# Patient Record
Sex: Female | Born: 1950 | Hispanic: No | Marital: Married | Smoking: Never smoker
Health system: Southern US, Community
[De-identification: ages and names within clinical notes are randomized; demographics above are authoritative.]

## PROBLEM LIST (undated history)

## (undated) DIAGNOSIS — E785 Hyperlipidemia, unspecified: Secondary | ICD-10-CM

## (undated) DIAGNOSIS — I2510 Atherosclerotic heart disease of native coronary artery without angina pectoris: Secondary | ICD-10-CM

## (undated) HISTORY — DX: Hyperlipidemia, unspecified: E78.5

## (undated) HISTORY — DX: Atherosclerotic heart disease of native coronary artery without angina pectoris: I25.10

---

## 2000-04-28 ENCOUNTER — Ambulatory Visit (HOSPITAL_COMMUNITY): Admission: RE | Admit: 2000-04-28 | Discharge: 2000-04-28 | Payer: Self-pay | Admitting: Endocrinology

## 2000-04-28 ENCOUNTER — Encounter: Payer: Self-pay | Admitting: Endocrinology

## 2000-05-13 ENCOUNTER — Other Ambulatory Visit: Admission: RE | Admit: 2000-05-13 | Discharge: 2000-05-13 | Payer: Self-pay | Admitting: Obstetrics and Gynecology

## 2001-05-18 ENCOUNTER — Other Ambulatory Visit: Admission: RE | Admit: 2001-05-18 | Discharge: 2001-05-18 | Payer: Self-pay | Admitting: Obstetrics and Gynecology

## 2001-12-06 ENCOUNTER — Other Ambulatory Visit: Admission: RE | Admit: 2001-12-06 | Discharge: 2001-12-06 | Payer: Self-pay | Admitting: Obstetrics and Gynecology

## 2002-07-10 ENCOUNTER — Other Ambulatory Visit: Admission: RE | Admit: 2002-07-10 | Discharge: 2002-07-10 | Payer: Self-pay | Admitting: Obstetrics and Gynecology

## 2003-02-07 ENCOUNTER — Other Ambulatory Visit: Admission: RE | Admit: 2003-02-07 | Discharge: 2003-02-07 | Payer: Self-pay | Admitting: Obstetrics and Gynecology

## 2004-03-11 ENCOUNTER — Other Ambulatory Visit: Admission: RE | Admit: 2004-03-11 | Discharge: 2004-03-11 | Payer: Self-pay | Admitting: Obstetrics and Gynecology

## 2005-04-28 ENCOUNTER — Other Ambulatory Visit: Admission: RE | Admit: 2005-04-28 | Discharge: 2005-04-28 | Payer: Self-pay | Admitting: Obstetrics and Gynecology

## 2006-12-09 ENCOUNTER — Emergency Department (HOSPITAL_COMMUNITY): Admission: EM | Admit: 2006-12-09 | Discharge: 2006-12-09 | Payer: Self-pay | Admitting: Emergency Medicine

## 2009-04-30 ENCOUNTER — Encounter: Admission: RE | Admit: 2009-04-30 | Discharge: 2009-04-30 | Payer: Self-pay | Admitting: Obstetrics and Gynecology

## 2010-08-03 ENCOUNTER — Encounter: Payer: Self-pay | Admitting: Obstetrics and Gynecology

## 2015-08-30 ENCOUNTER — Other Ambulatory Visit: Payer: Self-pay | Admitting: Obstetrics and Gynecology

## 2015-08-30 DIAGNOSIS — R928 Other abnormal and inconclusive findings on diagnostic imaging of breast: Secondary | ICD-10-CM

## 2015-09-06 ENCOUNTER — Ambulatory Visit
Admission: RE | Admit: 2015-09-06 | Discharge: 2015-09-06 | Disposition: A | Discharge status: Home / Self Care | Payer: BLUE CROSS/BLUE SHIELD | Source: Ambulatory Visit | Attending: Obstetrics and Gynecology | Admitting: Obstetrics and Gynecology

## 2015-09-06 DIAGNOSIS — R928 Other abnormal and inconclusive findings on diagnostic imaging of breast: Secondary | ICD-10-CM

## 2016-08-14 ENCOUNTER — Other Ambulatory Visit: Payer: Self-pay | Admitting: Obstetrics and Gynecology

## 2016-08-14 DIAGNOSIS — R921 Mammographic calcification found on diagnostic imaging of breast: Secondary | ICD-10-CM

## 2016-08-28 ENCOUNTER — Ambulatory Visit
Admission: RE | Admit: 2016-08-28 | Discharge: 2016-08-28 | Disposition: A | Discharge status: Home / Self Care | Payer: BLUE CROSS/BLUE SHIELD | Source: Ambulatory Visit | Attending: Obstetrics and Gynecology | Admitting: Obstetrics and Gynecology

## 2016-08-28 DIAGNOSIS — R921 Mammographic calcification found on diagnostic imaging of breast: Secondary | ICD-10-CM

## 2017-06-09 ENCOUNTER — Ambulatory Visit: Payer: BLUE CROSS/BLUE SHIELD | Admitting: Family Medicine

## 2017-09-23 ENCOUNTER — Other Ambulatory Visit: Payer: Self-pay | Admitting: Obstetrics and Gynecology

## 2017-09-23 DIAGNOSIS — R921 Mammographic calcification found on diagnostic imaging of breast: Secondary | ICD-10-CM

## 2017-10-20 ENCOUNTER — Ambulatory Visit
Admission: RE | Admit: 2017-10-20 | Discharge: 2017-10-20 | Disposition: A | Discharge status: Home / Self Care | Payer: BLUE CROSS/BLUE SHIELD | Source: Ambulatory Visit | Attending: Obstetrics and Gynecology | Admitting: Obstetrics and Gynecology

## 2017-10-20 DIAGNOSIS — R921 Mammographic calcification found on diagnostic imaging of breast: Secondary | ICD-10-CM

## 2018-05-17 DIAGNOSIS — R69 Illness, unspecified: Secondary | ICD-10-CM | POA: Diagnosis not present

## 2018-05-18 DIAGNOSIS — H52203 Unspecified astigmatism, bilateral: Secondary | ICD-10-CM | POA: Diagnosis not present

## 2018-05-18 DIAGNOSIS — H524 Presbyopia: Secondary | ICD-10-CM | POA: Diagnosis not present

## 2018-06-14 DIAGNOSIS — Z0100 Encounter for examination of eyes and vision without abnormal findings: Secondary | ICD-10-CM | POA: Diagnosis not present

## 2018-06-14 DIAGNOSIS — R69 Illness, unspecified: Secondary | ICD-10-CM | POA: Diagnosis not present

## 2018-06-21 DIAGNOSIS — E559 Vitamin D deficiency, unspecified: Secondary | ICD-10-CM | POA: Diagnosis not present

## 2018-06-21 DIAGNOSIS — E7800 Pure hypercholesterolemia, unspecified: Secondary | ICD-10-CM | POA: Diagnosis not present

## 2018-08-02 DIAGNOSIS — E7800 Pure hypercholesterolemia, unspecified: Secondary | ICD-10-CM | POA: Diagnosis not present

## 2018-08-02 DIAGNOSIS — R634 Abnormal weight loss: Secondary | ICD-10-CM | POA: Diagnosis not present

## 2018-09-06 DIAGNOSIS — Z0000 Encounter for general adult medical examination without abnormal findings: Secondary | ICD-10-CM | POA: Diagnosis not present

## 2018-09-06 DIAGNOSIS — Z8679 Personal history of other diseases of the circulatory system: Secondary | ICD-10-CM | POA: Diagnosis not present

## 2018-09-06 DIAGNOSIS — E7800 Pure hypercholesterolemia, unspecified: Secondary | ICD-10-CM | POA: Diagnosis not present

## 2019-03-13 DIAGNOSIS — H52203 Unspecified astigmatism, bilateral: Secondary | ICD-10-CM | POA: Diagnosis not present

## 2019-03-15 DIAGNOSIS — Z0100 Encounter for examination of eyes and vision without abnormal findings: Secondary | ICD-10-CM | POA: Diagnosis not present

## 2019-03-27 DIAGNOSIS — R69 Illness, unspecified: Secondary | ICD-10-CM | POA: Diagnosis not present

## 2019-04-06 DIAGNOSIS — K641 Second degree hemorrhoids: Secondary | ICD-10-CM | POA: Diagnosis not present

## 2019-04-06 DIAGNOSIS — K600 Acute anal fissure: Secondary | ICD-10-CM | POA: Diagnosis not present

## 2019-04-06 DIAGNOSIS — K625 Hemorrhage of anus and rectum: Secondary | ICD-10-CM | POA: Diagnosis not present

## 2019-04-06 DIAGNOSIS — Z1211 Encounter for screening for malignant neoplasm of colon: Secondary | ICD-10-CM | POA: Diagnosis not present

## 2019-04-14 DIAGNOSIS — Z1211 Encounter for screening for malignant neoplasm of colon: Secondary | ICD-10-CM | POA: Diagnosis not present

## 2019-04-14 DIAGNOSIS — K625 Hemorrhage of anus and rectum: Secondary | ICD-10-CM | POA: Diagnosis not present

## 2019-04-14 DIAGNOSIS — K641 Second degree hemorrhoids: Secondary | ICD-10-CM | POA: Diagnosis not present

## 2019-05-03 DIAGNOSIS — D225 Melanocytic nevi of trunk: Secondary | ICD-10-CM | POA: Diagnosis not present

## 2019-05-03 DIAGNOSIS — L710 Perioral dermatitis: Secondary | ICD-10-CM | POA: Diagnosis not present

## 2019-05-17 DIAGNOSIS — Z20828 Contact with and (suspected) exposure to other viral communicable diseases: Secondary | ICD-10-CM | POA: Diagnosis not present

## 2019-07-04 DIAGNOSIS — Z1231 Encounter for screening mammogram for malignant neoplasm of breast: Secondary | ICD-10-CM | POA: Diagnosis not present

## 2019-07-04 DIAGNOSIS — Z01419 Encounter for gynecological examination (general) (routine) without abnormal findings: Secondary | ICD-10-CM | POA: Diagnosis not present

## 2019-07-04 DIAGNOSIS — Z6823 Body mass index (BMI) 23.0-23.9, adult: Secondary | ICD-10-CM | POA: Diagnosis not present

## 2019-10-19 DIAGNOSIS — R69 Illness, unspecified: Secondary | ICD-10-CM | POA: Diagnosis not present

## 2019-11-20 DIAGNOSIS — Z8742 Personal history of other diseases of the female genital tract: Secondary | ICD-10-CM | POA: Diagnosis not present

## 2019-11-20 DIAGNOSIS — Z0000 Encounter for general adult medical examination without abnormal findings: Secondary | ICD-10-CM | POA: Diagnosis not present

## 2019-11-20 DIAGNOSIS — E7800 Pure hypercholesterolemia, unspecified: Secondary | ICD-10-CM | POA: Diagnosis not present

## 2019-11-20 DIAGNOSIS — Z8679 Personal history of other diseases of the circulatory system: Secondary | ICD-10-CM | POA: Diagnosis not present

## 2019-11-20 DIAGNOSIS — R0600 Dyspnea, unspecified: Secondary | ICD-10-CM | POA: Diagnosis not present

## 2019-11-20 DIAGNOSIS — R001 Bradycardia, unspecified: Secondary | ICD-10-CM | POA: Diagnosis not present

## 2019-11-20 DIAGNOSIS — E559 Vitamin D deficiency, unspecified: Secondary | ICD-10-CM | POA: Diagnosis not present

## 2019-11-20 DIAGNOSIS — R5383 Other fatigue: Secondary | ICD-10-CM | POA: Diagnosis not present

## 2019-11-20 DIAGNOSIS — Z833 Family history of diabetes mellitus: Secondary | ICD-10-CM | POA: Diagnosis not present

## 2019-11-28 DIAGNOSIS — R69 Illness, unspecified: Secondary | ICD-10-CM | POA: Diagnosis not present

## 2020-02-28 IMAGING — MG DIGITAL DIAGNOSTIC BILATERAL MAMMOGRAM WITH TOMO AND CAD
6 of 10 series · 6 of 26 positions shown · non-contrast
Comparison: Previous exam(s).

CLINICAL DATA: Short-term interval follow-up of probable benign
calcifications in the right breast.

EXAM:
DIGITAL DIAGNOSTIC BILATERAL MAMMOGRAM WITH CAD AND TOMO

[R ML]
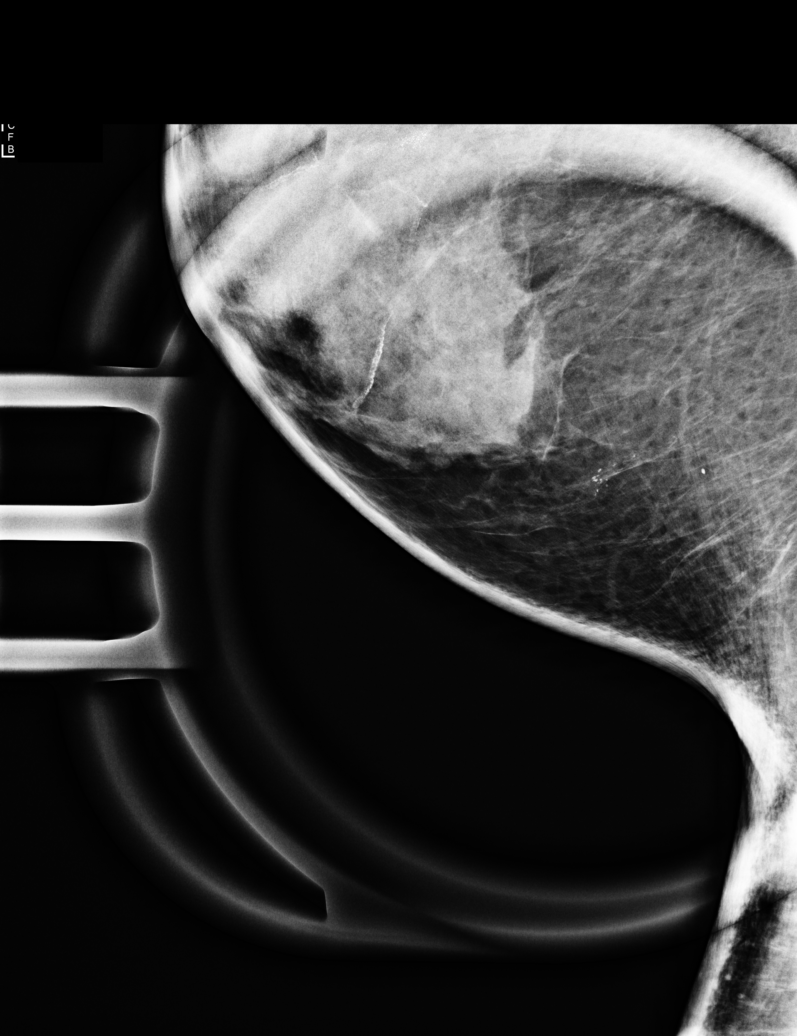

[R CC]
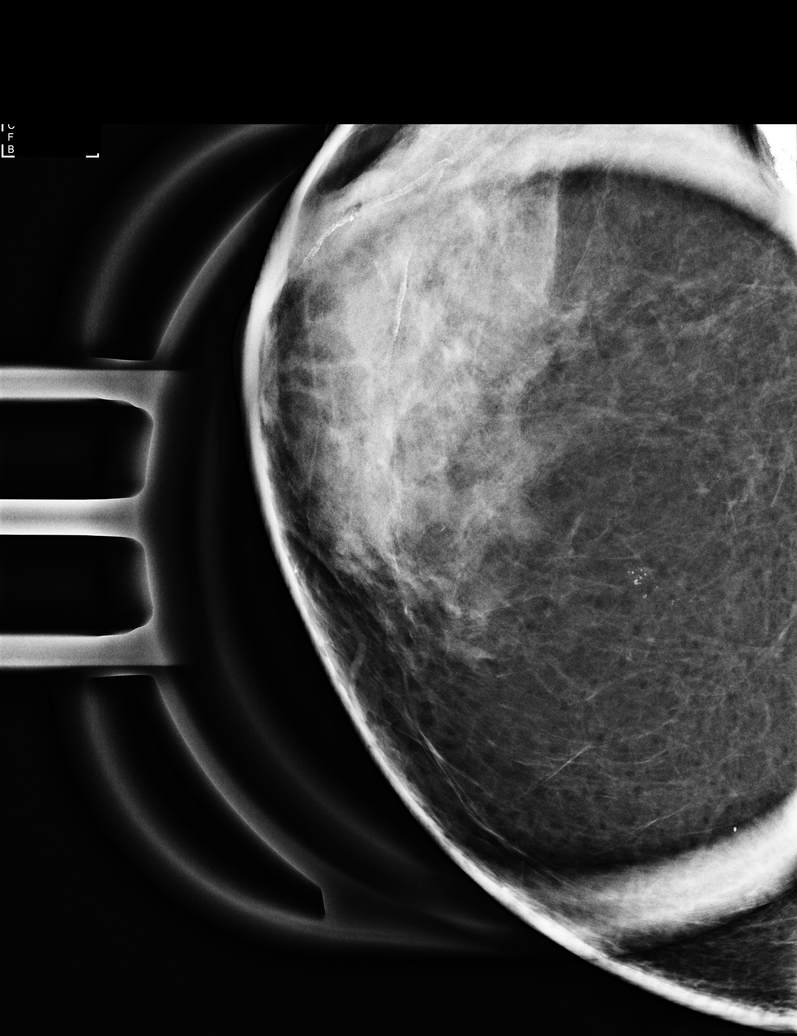

[R MLO synth-2D]
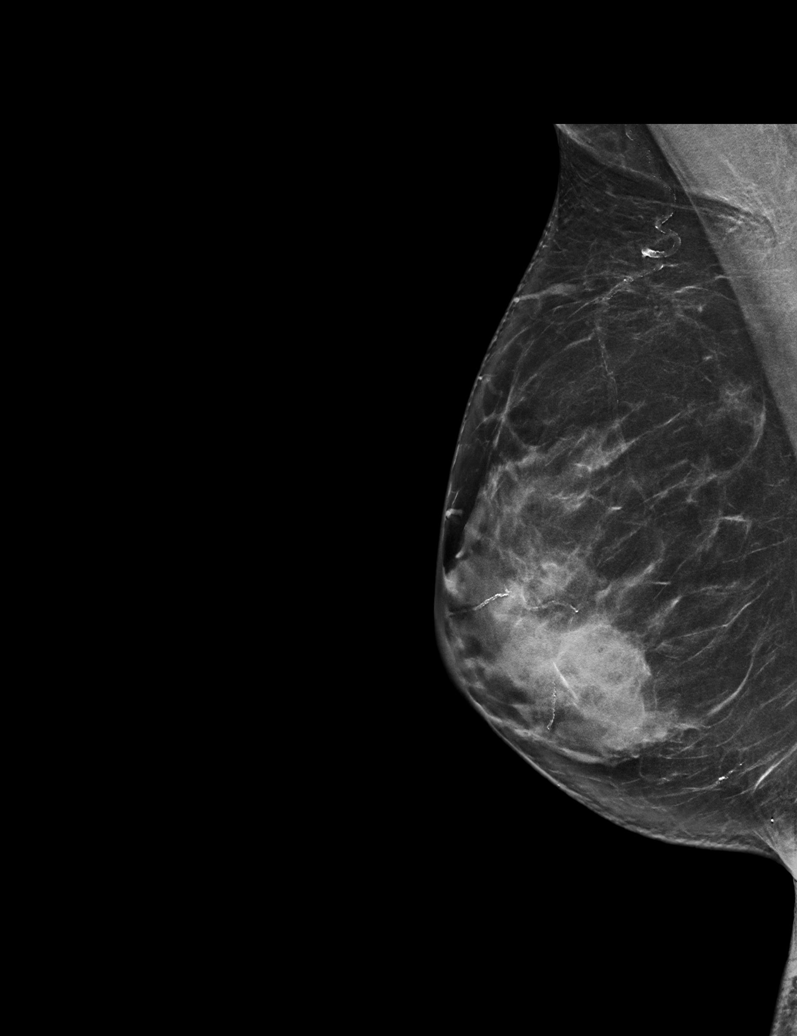

[L CC synth-2D]
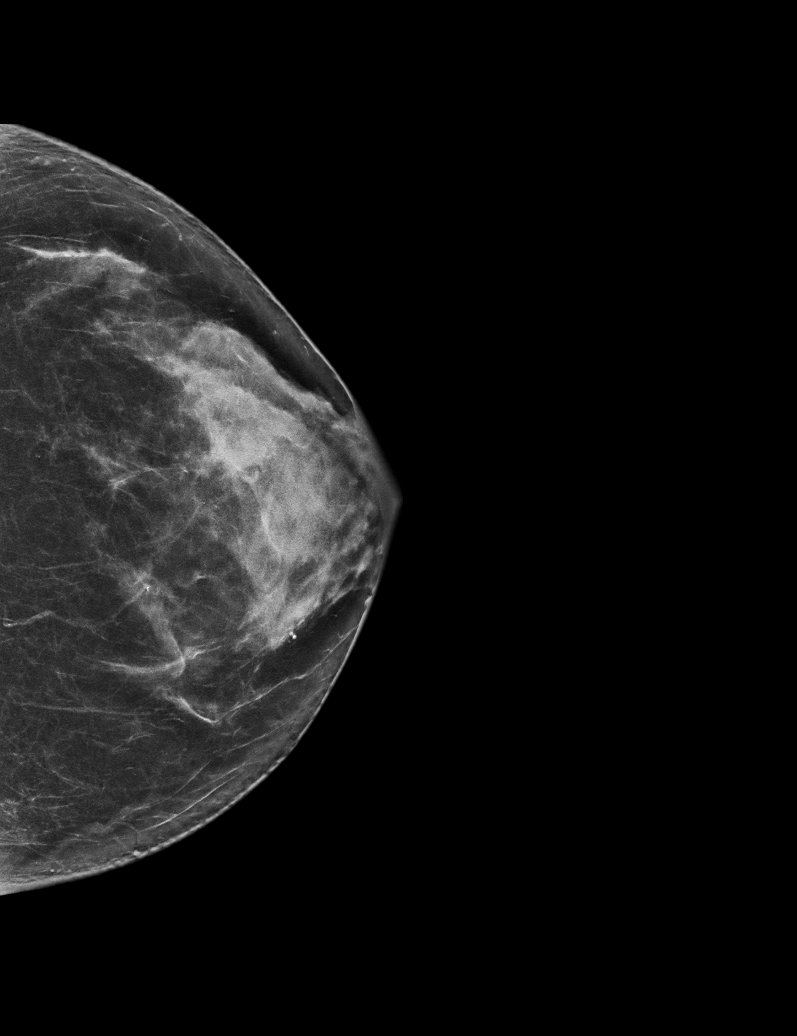

[R CC synth-2D]
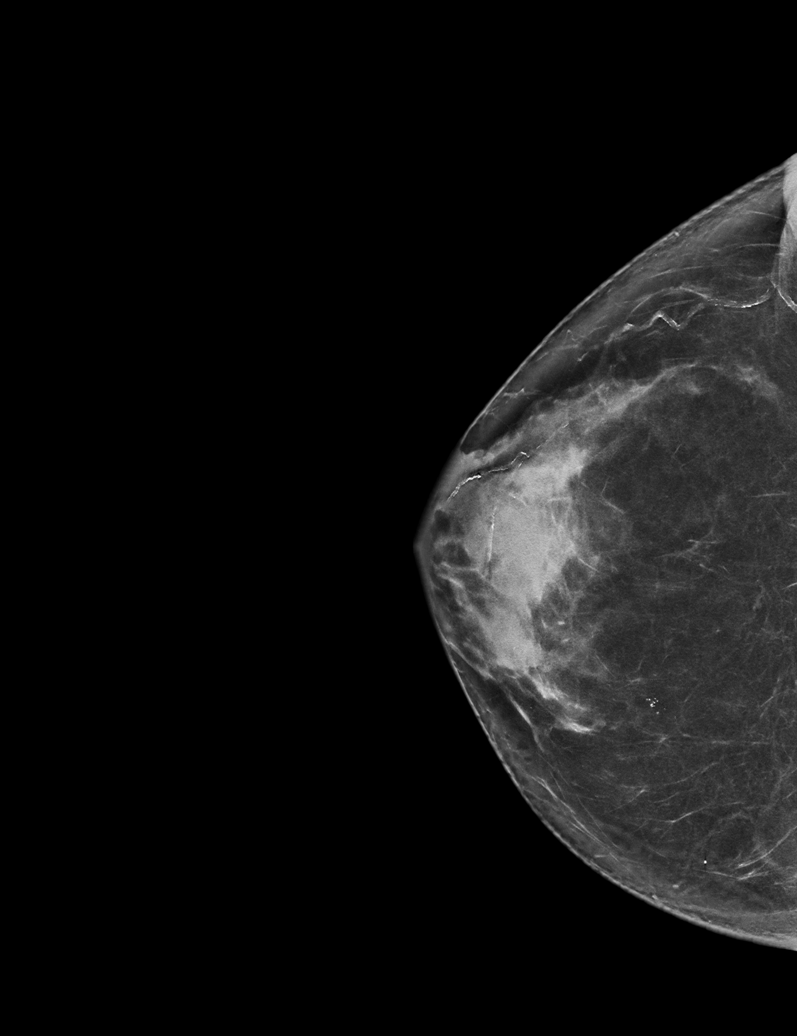

[L MLO synth-2D]
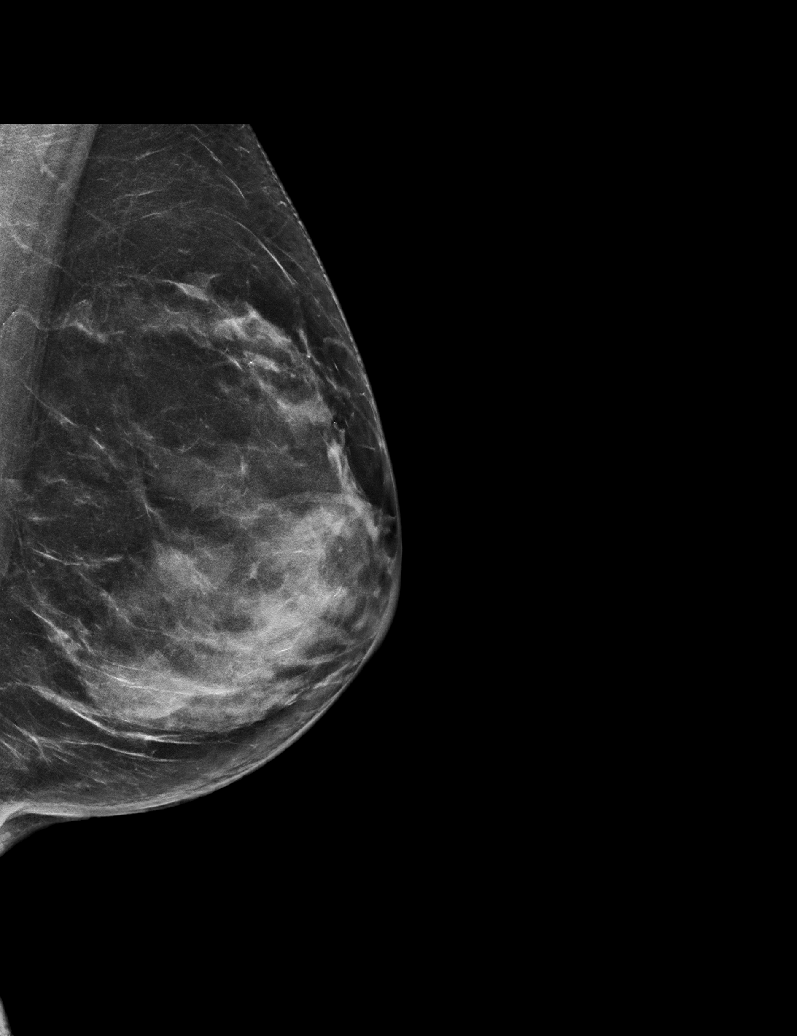

[6 of 26 positions shown; findings below may reference images not displayed]

ACR Breast Density Category c: The breast tissue is heterogeneously
dense, which may obscure small masses.
FINDINGS: Coarse calcifications spanning an area of approximately 6 mm in the
lower inner quadrant of the right breast are unchanged from prior
exam dated 09/06/2015. They are coarse and felt to be dystrophic. No
suspicious mass or malignant type microcalcifications identified in
either breast.

Mammographic images were processed with CAD.
IMPRESSION: Stable dystrophic appearing calcifications in the right breast. No
evidence of malignancy in either breast.

RECOMMENDATION:
Bilateral screening mammogram in 1 year is recommended.

I have discussed the findings and recommendations with the patient.
Results were also provided in writing at the conclusion of the
visit. If applicable, a reminder letter will be sent to the patient
regarding the next appointment.

BI-RADS CATEGORY  2: Benign.

## 2020-04-17 DIAGNOSIS — R69 Illness, unspecified: Secondary | ICD-10-CM | POA: Diagnosis not present

## 2020-05-01 DIAGNOSIS — H25012 Cortical age-related cataract, left eye: Secondary | ICD-10-CM | POA: Diagnosis not present

## 2020-05-01 DIAGNOSIS — H5203 Hypermetropia, bilateral: Secondary | ICD-10-CM | POA: Diagnosis not present

## 2020-05-01 DIAGNOSIS — H524 Presbyopia: Secondary | ICD-10-CM | POA: Diagnosis not present

## 2020-05-01 DIAGNOSIS — H04123 Dry eye syndrome of bilateral lacrimal glands: Secondary | ICD-10-CM | POA: Diagnosis not present

## 2020-05-21 DIAGNOSIS — Z0100 Encounter for examination of eyes and vision without abnormal findings: Secondary | ICD-10-CM | POA: Diagnosis not present

## 2020-08-12 DIAGNOSIS — E559 Vitamin D deficiency, unspecified: Secondary | ICD-10-CM | POA: Diagnosis not present

## 2020-08-12 DIAGNOSIS — R200 Anesthesia of skin: Secondary | ICD-10-CM | POA: Diagnosis not present

## 2021-02-10 ENCOUNTER — Other Ambulatory Visit: Payer: Self-pay | Admitting: Internal Medicine

## 2021-02-10 DIAGNOSIS — Z1211 Encounter for screening for malignant neoplasm of colon: Secondary | ICD-10-CM | POA: Diagnosis not present

## 2021-02-10 DIAGNOSIS — R739 Hyperglycemia, unspecified: Secondary | ICD-10-CM | POA: Diagnosis not present

## 2021-02-10 DIAGNOSIS — Z0000 Encounter for general adult medical examination without abnormal findings: Secondary | ICD-10-CM | POA: Diagnosis not present

## 2021-02-10 DIAGNOSIS — Z8679 Personal history of other diseases of the circulatory system: Secondary | ICD-10-CM | POA: Diagnosis not present

## 2021-02-10 DIAGNOSIS — Z833 Family history of diabetes mellitus: Secondary | ICD-10-CM | POA: Diagnosis not present

## 2021-02-10 DIAGNOSIS — E7800 Pure hypercholesterolemia, unspecified: Secondary | ICD-10-CM | POA: Diagnosis not present

## 2021-02-10 DIAGNOSIS — Z1231 Encounter for screening mammogram for malignant neoplasm of breast: Secondary | ICD-10-CM | POA: Diagnosis not present

## 2021-02-24 DIAGNOSIS — Z1231 Encounter for screening mammogram for malignant neoplasm of breast: Secondary | ICD-10-CM | POA: Diagnosis not present

## 2021-02-24 DIAGNOSIS — Z124 Encounter for screening for malignant neoplasm of cervix: Secondary | ICD-10-CM | POA: Diagnosis not present

## 2021-02-24 DIAGNOSIS — Z6822 Body mass index (BMI) 22.0-22.9, adult: Secondary | ICD-10-CM | POA: Diagnosis not present

## 2021-03-03 ENCOUNTER — Other Ambulatory Visit: Payer: BLUE CROSS/BLUE SHIELD

## 2021-03-07 ENCOUNTER — Ambulatory Visit
Admission: RE | Admit: 2021-03-07 | Discharge: 2021-03-07 | Disposition: A | Discharge status: Home / Self Care | Payer: No Typology Code available for payment source | Source: Ambulatory Visit | Attending: Internal Medicine | Admitting: Internal Medicine

## 2021-03-07 DIAGNOSIS — Z8679 Personal history of other diseases of the circulatory system: Secondary | ICD-10-CM

## 2021-04-18 ENCOUNTER — Ambulatory Visit: Payer: No Typology Code available for payment source | Admitting: Cardiology

## 2021-04-25 DIAGNOSIS — H524 Presbyopia: Secondary | ICD-10-CM | POA: Diagnosis not present

## 2021-04-25 DIAGNOSIS — H2513 Age-related nuclear cataract, bilateral: Secondary | ICD-10-CM | POA: Diagnosis not present

## 2021-04-25 DIAGNOSIS — H25013 Cortical age-related cataract, bilateral: Secondary | ICD-10-CM | POA: Diagnosis not present

## 2021-04-25 DIAGNOSIS — H43813 Vitreous degeneration, bilateral: Secondary | ICD-10-CM | POA: Diagnosis not present

## 2021-05-13 ENCOUNTER — Ambulatory Visit: Payer: No Typology Code available for payment source | Admitting: Cardiology

## 2021-05-14 DIAGNOSIS — R69 Illness, unspecified: Secondary | ICD-10-CM | POA: Diagnosis not present

## 2021-05-14 DIAGNOSIS — R030 Elevated blood-pressure reading, without diagnosis of hypertension: Secondary | ICD-10-CM | POA: Diagnosis not present

## 2021-05-14 DIAGNOSIS — Z8249 Family history of ischemic heart disease and other diseases of the circulatory system: Secondary | ICD-10-CM | POA: Diagnosis not present

## 2021-05-14 DIAGNOSIS — R32 Unspecified urinary incontinence: Secondary | ICD-10-CM | POA: Diagnosis not present

## 2021-05-14 DIAGNOSIS — E785 Hyperlipidemia, unspecified: Secondary | ICD-10-CM | POA: Diagnosis not present

## 2021-05-14 DIAGNOSIS — Z803 Family history of malignant neoplasm of breast: Secondary | ICD-10-CM | POA: Diagnosis not present

## 2021-05-20 DIAGNOSIS — E7800 Pure hypercholesterolemia, unspecified: Secondary | ICD-10-CM | POA: Diagnosis not present

## 2021-05-20 DIAGNOSIS — Z79899 Other long term (current) drug therapy: Secondary | ICD-10-CM | POA: Diagnosis not present

## 2021-05-22 DIAGNOSIS — R7303 Prediabetes: Secondary | ICD-10-CM | POA: Diagnosis not present

## 2021-05-22 DIAGNOSIS — Z833 Family history of diabetes mellitus: Secondary | ICD-10-CM | POA: Diagnosis not present

## 2021-05-22 DIAGNOSIS — E7800 Pure hypercholesterolemia, unspecified: Secondary | ICD-10-CM | POA: Diagnosis not present

## 2021-06-09 DIAGNOSIS — Z0100 Encounter for examination of eyes and vision without abnormal findings: Secondary | ICD-10-CM | POA: Diagnosis not present

## 2021-06-11 ENCOUNTER — Ambulatory Visit: Payer: No Typology Code available for payment source | Admitting: Cardiology

## 2021-10-21 DIAGNOSIS — M8588 Other specified disorders of bone density and structure, other site: Secondary | ICD-10-CM | POA: Diagnosis not present

## 2021-10-21 DIAGNOSIS — N958 Other specified menopausal and perimenopausal disorders: Secondary | ICD-10-CM | POA: Diagnosis not present

## 2021-11-17 DIAGNOSIS — E7800 Pure hypercholesterolemia, unspecified: Secondary | ICD-10-CM | POA: Diagnosis not present

## 2021-11-17 DIAGNOSIS — Z79899 Other long term (current) drug therapy: Secondary | ICD-10-CM | POA: Diagnosis not present

## 2021-11-17 DIAGNOSIS — R7309 Other abnormal glucose: Secondary | ICD-10-CM | POA: Diagnosis not present

## 2021-11-17 DIAGNOSIS — E559 Vitamin D deficiency, unspecified: Secondary | ICD-10-CM | POA: Diagnosis not present

## 2021-11-20 DIAGNOSIS — J40 Bronchitis, not specified as acute or chronic: Secondary | ICD-10-CM | POA: Diagnosis not present

## 2021-11-20 DIAGNOSIS — I25119 Atherosclerotic heart disease of native coronary artery with unspecified angina pectoris: Secondary | ICD-10-CM | POA: Diagnosis not present

## 2021-11-20 DIAGNOSIS — R7309 Other abnormal glucose: Secondary | ICD-10-CM | POA: Diagnosis not present

## 2021-11-20 DIAGNOSIS — E7800 Pure hypercholesterolemia, unspecified: Secondary | ICD-10-CM | POA: Diagnosis not present

## 2021-11-20 DIAGNOSIS — R030 Elevated blood-pressure reading, without diagnosis of hypertension: Secondary | ICD-10-CM | POA: Diagnosis not present

## 2022-01-14 ENCOUNTER — Ambulatory Visit: Payer: No Typology Code available for payment source | Admitting: Internal Medicine

## 2022-03-10 ENCOUNTER — Ambulatory Visit (INDEPENDENT_AMBULATORY_CARE_PROVIDER_SITE_OTHER): Payer: Medicare HMO | Admitting: Internal Medicine

## 2022-03-10 ENCOUNTER — Encounter (HOSPITAL_BASED_OUTPATIENT_CLINIC_OR_DEPARTMENT_OTHER): Payer: Self-pay | Admitting: Internal Medicine

## 2022-03-10 VITALS — BP 133/76 | HR 62 | Ht 64.0 in | Wt 131.0 lb

## 2022-03-10 DIAGNOSIS — R931 Abnormal findings on diagnostic imaging of heart and coronary circulation: Secondary | ICD-10-CM | POA: Diagnosis not present

## 2022-03-10 DIAGNOSIS — E785 Hyperlipidemia, unspecified: Secondary | ICD-10-CM | POA: Diagnosis not present

## 2022-03-10 NOTE — Patient Instructions (Signed)
Medication Instructions:  NO CHANGES today   *If you need a refill on your cardiac medications before your next appointment, please call your pharmacy*   Lab Work: FASTING lab work to check cholesterol as soon as able   If you have labs (blood work) drawn today and your tests are completely normal, you will receive your results only by: [place] (if you have MyChart) OR A paper copy in the mail If you have any lab test that is abnormal or we need to change your treatment, we will call you to review the results.   Follow-Up: At [facility], you and your health needs are our priority.  As part of our continuing mission to provide you with exceptional heart care, we have created designated Provider Care Teams.  These Care Teams include your primary Cardiologist (physician) and Advanced Practice Providers (APPs -  Physician Assistants and Nurse Practitioners) who all work together to provide you with the care you need, when you need it.  We recommend signing up for the patient portal called "MyChart".  Sign up information is provided on this After Visit Summary.  MyChart is used to connect with patients for Virtual Visits (Telemedicine).  Patients are able to view lab/test results, encounter notes, upcoming appointments, etc.  Non-urgent messages can be sent to your provider as well.   To learn more about what you can do with MyChart, go to [email or web address].    Your next appointment:   AS NEEDED with Dr. Debara Pickett

## 2022-03-11 ENCOUNTER — Encounter (HOSPITAL_BASED_OUTPATIENT_CLINIC_OR_DEPARTMENT_OTHER): Payer: Self-pay | Admitting: Internal Medicine

## 2022-03-11 NOTE — Progress Notes (Addendum)
LIPID CLINIC CONSULT NOTE  Chief Complaint:  Manage dyslipidemia  Primary Care Physician: Leeroy Cha, MD  Primary Cardiologist:  None  HPI:  Madison Jensen is a 71 y.o. [place] dyslipidemia female who is being seen today for the evaluation of dyslipidemia at the request of Dr Fara Olden. This is a pleasant 71 year old female with a history of high cholesterol who had a coronary artery calcium score that was elevated at 207, which is 89th percentile for age and sex matched controls.  She has had recent lipid testing which showed total cholesterol 131, triglycerides 63, HDL 50 and LDL of 68 in May 2023.  She has been on 10 mg of rosuvastatin.  She currently denies any chest pain or worsening shortness of breath.  She says she is physically active and exercises regularly.  Other risk factors include a family history of diabetes, sinus bradycardia and history of cardiomegaly.  PMHx:  Past Medical History:  Diagnosis Date   Coronary artery disease    Hyperlipidemia     FAMHx:  Family history of high cholesterol, heart disease and diabetes  SOCHx:   reports that she has never smoked. She has never used smokeless tobacco. No history on file for alcohol use and drug use.  ALLERGIES:  No Known Allergies  ROS: Pertinent items noted in HPI and remainder of comprehensive ROS otherwise negative.  HOME MEDS: Current Outpatient Medications on File Prior to Visit  Medication Sig Dispense Refill   Ascorbic Acid (VITAMIN C PO) Take 500 mg by mouth daily.     Coenzyme Q-10 100 MG capsule Take 100 mg by mouth daily.     Menaquinone-7 (VITAMIN K2 PO) Take 150 mcg by mouth daily.     Multiple Vitamin (MULTIVITAMIN ADULT PO) daily.     rosuvastatin (CRESTOR) 10 MG tablet Take 10 mg by mouth. 50 mg weekly     VITAMIN D, CHOLECALCIFEROL, PO Take 4,000 Units by mouth daily.     No current facility-administered medications on file prior to visit.    LABS/IMAGING: No results  found for this or any previous visit (from the past 48 hour(s)). No results found.  LIPID PANEL: No results found for: "CHOL", "TRIG", "HDL", "CHOLHDL", "VLDL", "LDLCALC", "LDLDIRECT"  WEIGHTS: Wt Readings from Last 3 Encounters:  03/10/22 131 lb (59.4 kg)    VITALS: BP 133/76   Pulse 62   Ht '5\' 4"'$  (1.626 m)   Wt 131 lb (59.4 kg)   BMI 22.49 kg/m   EXAM: Deferred  EKG: Deferred  ASSESSMENT: Mixed dyslipidemia, goal LDL less than 70 Elevated coronary calcium score of 207, 89th percentile for age and sex matched controls Family history of dyslipidemia/coronary artery disease  PLAN: 1.   [name] has an elevated coronary artery calcium score which is age advanced at the 89th percentile.  She is currently on a lower dose of a high potency statin.  We will repeat a lipid NMR, APO B and LP(a) and recommend changes accordingly.  Her target LDL is less than 70 and it would be ideal for her to be on a high intensity statin dose such as 20 or 40 mg of rosuvastatin or 40 or 80 mg of atorvastatin.  Follow-up afterward as needed if she has not reached target on current therapy.  Thanks for the kind referral.  Pixie Casino, MD, FACC, [place] Director of the [facility]  [facility] Diplomate of  the [employer] of Clinical Lipidology Attending Cardiologist  Direct Dial: [phone]  Fax: [phone]  Website:  [email or web address]  Nadean Corwin Katrinia Straker 03/11/2022, 5:10 PM

## 2022-03-17 DIAGNOSIS — E785 Hyperlipidemia, unspecified: Secondary | ICD-10-CM | POA: Diagnosis not present

## 2022-03-18 LAB — NMR, LIPOPROFILE
Cholesterol, Total: 136 mg/dL (ref 100–199)
HDL Particle Number: 40.2 umol/L (ref >=30.5)
HDL-C: 55 mg/dL (ref >39)
LDL Particle Number: 887 nmol/L (ref <1000)
LDL Size: 20.5 nm — ABNORMAL LOW (ref >20.5)
LDL-C (NIH Calc): 68 mg/dL (ref 0–99)
LP-IR Score: 39 (ref <=45)
Small LDL Particle Number: 399 nmol/L (ref <=527)
Triglycerides: 63 mg/dL (ref 0–149)

## 2022-03-18 LAB — LIPOPROTEIN A (LPA): Lipoprotein (a): 36.5 nmol/L (ref <75.0)

## 2022-03-18 LAB — APOLIPOPROTEIN B: Apolipoprotein B: 59 mg/dL (ref <90)

## 2022-03-23 DIAGNOSIS — E559 Vitamin D deficiency, unspecified: Secondary | ICD-10-CM | POA: Diagnosis not present

## 2022-03-23 DIAGNOSIS — I25119 Atherosclerotic heart disease of native coronary artery with unspecified angina pectoris: Secondary | ICD-10-CM | POA: Diagnosis not present

## 2022-03-23 DIAGNOSIS — M85859 Other specified disorders of bone density and structure, unspecified thigh: Secondary | ICD-10-CM | POA: Diagnosis not present

## 2022-03-24 DIAGNOSIS — Z833 Family history of diabetes mellitus: Secondary | ICD-10-CM | POA: Diagnosis not present

## 2022-03-24 DIAGNOSIS — Z8679 Personal history of other diseases of the circulatory system: Secondary | ICD-10-CM | POA: Diagnosis not present

## 2022-03-24 DIAGNOSIS — Z0000 Encounter for general adult medical examination without abnormal findings: Secondary | ICD-10-CM | POA: Diagnosis not present

## 2022-03-24 DIAGNOSIS — E7800 Pure hypercholesterolemia, unspecified: Secondary | ICD-10-CM | POA: Diagnosis not present

## 2022-03-24 DIAGNOSIS — I25119 Atherosclerotic heart disease of native coronary artery with unspecified angina pectoris: Secondary | ICD-10-CM | POA: Diagnosis not present

## 2022-03-24 DIAGNOSIS — M85859 Other specified disorders of bone density and structure, unspecified thigh: Secondary | ICD-10-CM | POA: Diagnosis not present

## 2022-03-24 DIAGNOSIS — E559 Vitamin D deficiency, unspecified: Secondary | ICD-10-CM | POA: Diagnosis not present

## 2022-03-24 DIAGNOSIS — Z8041 Family history of malignant neoplasm of ovary: Secondary | ICD-10-CM | POA: Diagnosis not present

## 2022-03-27 ENCOUNTER — Ambulatory Visit: Payer: No Typology Code available for payment source | Admitting: Internal Medicine

## 2022-03-31 DIAGNOSIS — Z008 Encounter for other general examination: Secondary | ICD-10-CM | POA: Diagnosis not present

## 2022-03-31 DIAGNOSIS — E785 Hyperlipidemia, unspecified: Secondary | ICD-10-CM | POA: Diagnosis not present

## 2022-03-31 DIAGNOSIS — I10 Essential (primary) hypertension: Secondary | ICD-10-CM | POA: Diagnosis not present

## 2022-03-31 DIAGNOSIS — Z8249 Family history of ischemic heart disease and other diseases of the circulatory system: Secondary | ICD-10-CM | POA: Diagnosis not present

## 2022-03-31 DIAGNOSIS — Z791 Long term (current) use of non-steroidal anti-inflammatories (NSAID): Secondary | ICD-10-CM | POA: Diagnosis not present

## 2022-03-31 DIAGNOSIS — Z833 Family history of diabetes mellitus: Secondary | ICD-10-CM | POA: Diagnosis not present

## 2022-03-31 DIAGNOSIS — Z809 Family history of malignant neoplasm, unspecified: Secondary | ICD-10-CM | POA: Diagnosis not present

## 2022-04-10 ENCOUNTER — Encounter: Payer: Self-pay | Admitting: Internal Medicine

## 2022-04-28 DIAGNOSIS — Z01419 Encounter for gynecological examination (general) (routine) without abnormal findings: Secondary | ICD-10-CM | POA: Diagnosis not present

## 2022-04-28 DIAGNOSIS — Z1231 Encounter for screening mammogram for malignant neoplasm of breast: Secondary | ICD-10-CM | POA: Diagnosis not present

## 2022-04-28 DIAGNOSIS — Z6822 Body mass index (BMI) 22.0-22.9, adult: Secondary | ICD-10-CM | POA: Diagnosis not present

## 2022-04-29 ENCOUNTER — Ambulatory Visit: Payer: No Typology Code available for payment source | Admitting: Internal Medicine

## 2022-05-08 DIAGNOSIS — H25013 Cortical age-related cataract, bilateral: Secondary | ICD-10-CM | POA: Diagnosis not present

## 2022-05-08 DIAGNOSIS — H2513 Age-related nuclear cataract, bilateral: Secondary | ICD-10-CM | POA: Diagnosis not present

## 2022-05-08 DIAGNOSIS — H5203 Hypermetropia, bilateral: Secondary | ICD-10-CM | POA: Diagnosis not present

## 2022-05-08 DIAGNOSIS — H524 Presbyopia: Secondary | ICD-10-CM | POA: Diagnosis not present

## 2022-05-08 DIAGNOSIS — H04123 Dry eye syndrome of bilateral lacrimal glands: Secondary | ICD-10-CM | POA: Diagnosis not present

## 2022-05-15 ENCOUNTER — Ambulatory Visit: Payer: No Typology Code available for payment source | Admitting: Internal Medicine

## 2022-08-28 DIAGNOSIS — R030 Elevated blood-pressure reading, without diagnosis of hypertension: Secondary | ICD-10-CM | POA: Diagnosis not present

## 2022-08-28 DIAGNOSIS — H6121 Impacted cerumen, right ear: Secondary | ICD-10-CM | POA: Diagnosis not present

## 2022-08-28 DIAGNOSIS — Z6822 Body mass index (BMI) 22.0-22.9, adult: Secondary | ICD-10-CM | POA: Diagnosis not present

## 2022-11-14 DIAGNOSIS — R69 Illness, unspecified: Secondary | ICD-10-CM | POA: Diagnosis not present

## 2022-11-23 DIAGNOSIS — R69 Illness, unspecified: Secondary | ICD-10-CM | POA: Diagnosis not present

## 2023-03-26 DIAGNOSIS — I25119 Atherosclerotic heart disease of native coronary artery with unspecified angina pectoris: Secondary | ICD-10-CM | POA: Diagnosis not present

## 2023-03-26 DIAGNOSIS — R7309 Other abnormal glucose: Secondary | ICD-10-CM | POA: Diagnosis not present

## 2023-03-26 DIAGNOSIS — R7303 Prediabetes: Secondary | ICD-10-CM | POA: Diagnosis not present

## 2023-03-26 DIAGNOSIS — E559 Vitamin D deficiency, unspecified: Secondary | ICD-10-CM | POA: Diagnosis not present

## 2023-03-30 DIAGNOSIS — Z8679 Personal history of other diseases of the circulatory system: Secondary | ICD-10-CM | POA: Diagnosis not present

## 2023-03-30 DIAGNOSIS — R7303 Prediabetes: Secondary | ICD-10-CM | POA: Diagnosis not present

## 2023-03-30 DIAGNOSIS — I25119 Atherosclerotic heart disease of native coronary artery with unspecified angina pectoris: Secondary | ICD-10-CM | POA: Diagnosis not present

## 2023-03-30 DIAGNOSIS — Z0000 Encounter for general adult medical examination without abnormal findings: Secondary | ICD-10-CM | POA: Diagnosis not present

## 2023-03-30 DIAGNOSIS — Z8041 Family history of malignant neoplasm of ovary: Secondary | ICD-10-CM | POA: Diagnosis not present

## 2023-03-30 DIAGNOSIS — Z1331 Encounter for screening for depression: Secondary | ICD-10-CM | POA: Diagnosis not present

## 2023-03-30 DIAGNOSIS — E7800 Pure hypercholesterolemia, unspecified: Secondary | ICD-10-CM | POA: Diagnosis not present

## 2023-03-30 DIAGNOSIS — M85859 Other specified disorders of bone density and structure, unspecified thigh: Secondary | ICD-10-CM | POA: Diagnosis not present

## 2023-03-30 DIAGNOSIS — Z833 Family history of diabetes mellitus: Secondary | ICD-10-CM | POA: Diagnosis not present

## 2023-03-30 DIAGNOSIS — E559 Vitamin D deficiency, unspecified: Secondary | ICD-10-CM | POA: Diagnosis not present

## 2023-03-30 DIAGNOSIS — Z1231 Encounter for screening mammogram for malignant neoplasm of breast: Secondary | ICD-10-CM | POA: Diagnosis not present

## 2023-05-27 DIAGNOSIS — Z01419 Encounter for gynecological examination (general) (routine) without abnormal findings: Secondary | ICD-10-CM | POA: Diagnosis not present

## 2023-05-27 DIAGNOSIS — Z1231 Encounter for screening mammogram for malignant neoplasm of breast: Secondary | ICD-10-CM | POA: Diagnosis not present

## 2023-05-27 DIAGNOSIS — Z6824 Body mass index (BMI) 24.0-24.9, adult: Secondary | ICD-10-CM | POA: Diagnosis not present

## 2023-06-21 DIAGNOSIS — H5203 Hypermetropia, bilateral: Secondary | ICD-10-CM | POA: Diagnosis not present

## 2023-06-21 DIAGNOSIS — H52203 Unspecified astigmatism, bilateral: Secondary | ICD-10-CM | POA: Diagnosis not present

## 2023-06-21 DIAGNOSIS — H25013 Cortical age-related cataract, bilateral: Secondary | ICD-10-CM | POA: Diagnosis not present

## 2023-06-21 DIAGNOSIS — H2513 Age-related nuclear cataract, bilateral: Secondary | ICD-10-CM | POA: Diagnosis not present

## 2023-07-12 DIAGNOSIS — R69 Illness, unspecified: Secondary | ICD-10-CM | POA: Diagnosis not present

## 2024-04-10 DIAGNOSIS — Z1331 Encounter for screening for depression: Secondary | ICD-10-CM | POA: Diagnosis not present

## 2024-04-10 DIAGNOSIS — Z0000 Encounter for general adult medical examination without abnormal findings: Secondary | ICD-10-CM | POA: Diagnosis not present

## 2024-05-30 DIAGNOSIS — Z124 Encounter for screening for malignant neoplasm of cervix: Secondary | ICD-10-CM | POA: Diagnosis not present

## 2024-06-23 DIAGNOSIS — H2513 Age-related nuclear cataract, bilateral: Secondary | ICD-10-CM | POA: Diagnosis not present

## 2024-06-23 DIAGNOSIS — H25013 Cortical age-related cataract, bilateral: Secondary | ICD-10-CM | POA: Diagnosis not present

## 2024-06-23 DIAGNOSIS — H52203 Unspecified astigmatism, bilateral: Secondary | ICD-10-CM | POA: Diagnosis not present

## 2024-06-23 DIAGNOSIS — H5203 Hypermetropia, bilateral: Secondary | ICD-10-CM | POA: Diagnosis not present
